# Patient Record
Sex: Male | Born: 1985 | Race: White | Hispanic: Yes | Marital: Married | Smoking: Never smoker
Health system: Southern US, Community
[De-identification: ages and names within clinical notes are randomized; demographics above are authoritative.]

---

## 2014-10-06 ENCOUNTER — Emergency Department (HOSPITAL_COMMUNITY): Payer: No Typology Code available for payment source

## 2014-10-06 ENCOUNTER — Encounter (HOSPITAL_COMMUNITY): Payer: Self-pay | Admitting: Emergency Medicine

## 2014-10-06 ENCOUNTER — Emergency Department (HOSPITAL_COMMUNITY)
Admission: EM | Admit: 2014-10-06 | Discharge: 2014-10-06 | Disposition: A | Discharge status: Home / Self Care | Payer: No Typology Code available for payment source | Attending: Emergency Medicine | Admitting: Emergency Medicine

## 2014-10-06 DIAGNOSIS — Y9389 Activity, other specified: Secondary | ICD-10-CM | POA: Insufficient documentation

## 2014-10-06 DIAGNOSIS — S8992XA Unspecified injury of left lower leg, initial encounter: Secondary | ICD-10-CM | POA: Insufficient documentation

## 2014-10-06 DIAGNOSIS — R200 Anesthesia of skin: Secondary | ICD-10-CM | POA: Diagnosis not present

## 2014-10-06 DIAGNOSIS — S199XXA Unspecified injury of neck, initial encounter: Secondary | ICD-10-CM | POA: Insufficient documentation

## 2014-10-06 DIAGNOSIS — S3992XA Unspecified injury of lower back, initial encounter: Secondary | ICD-10-CM | POA: Insufficient documentation

## 2014-10-06 DIAGNOSIS — Y998 Other external cause status: Secondary | ICD-10-CM | POA: Diagnosis not present

## 2014-10-06 DIAGNOSIS — Y92410 Unspecified street and highway as the place of occurrence of the external cause: Secondary | ICD-10-CM | POA: Insufficient documentation

## 2014-10-06 DIAGNOSIS — M542 Cervicalgia: Secondary | ICD-10-CM

## 2014-10-06 MED ORDER — CYCLOBENZAPRINE HCL 10 MG PO TABS
10.0000 mg | ORAL_TABLET | Freq: Two times a day (BID) | ORAL | Status: AC | PRN
Start: 2014-10-06 — End: ?

## 2014-10-06 MED ORDER — ACETAMINOPHEN 325 MG PO TABS
650.0000 mg | ORAL_TABLET | Freq: Once | ORAL | Status: AC
  Administered 2014-10-06: 650 mg via ORAL
  Filled 2014-10-06: qty 2

## 2014-10-06 MED ORDER — IOHEXOL 350 MG/ML SOLN
50.0000 mL | Freq: Once | INTRAVENOUS | Status: AC | PRN
  Administered 2014-10-06: 50 mL via INTRAVENOUS

## 2014-10-06 NOTE — ED Notes (Signed)
CT notified pt has IV and is ready for transport.
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
## 2014-10-06 NOTE — ED Notes (Signed)
Pt comfortable with discharge and follow up instructions. Pt declines wheelchair, escorted to waiting area by this RN. Prescriptions x1. 
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
## 2022-03-23 ENCOUNTER — Encounter (HOSPITAL_BASED_OUTPATIENT_CLINIC_OR_DEPARTMENT_OTHER): Payer: Self-pay | Admitting: Emergency Medicine

## 2022-03-23 ENCOUNTER — Emergency Department (HOSPITAL_COMMUNITY): Admission: EM | Admit: 2022-03-23 | Discharge: 2022-03-23 | Discharge status: Against Medical Advice | Payer: Self-pay

## 2022-03-23 ENCOUNTER — Emergency Department (HOSPITAL_BASED_OUTPATIENT_CLINIC_OR_DEPARTMENT_OTHER)
Admission: EM | Admit: 2022-03-23 | Discharge: 2022-03-23 | Disposition: A | Discharge status: Home / Self Care | Payer: Self-pay | Attending: Emergency Medicine | Admitting: Emergency Medicine

## 2022-03-23 DIAGNOSIS — L0291 Cutaneous abscess, unspecified: Secondary | ICD-10-CM

## 2022-03-23 DIAGNOSIS — L0231 Cutaneous abscess of buttock: Secondary | ICD-10-CM | POA: Insufficient documentation

## 2022-03-23 MED ORDER — ACETAMINOPHEN 500 MG PO TABS
1000.0000 mg | ORAL_TABLET | Freq: Once | ORAL | Status: AC
  Administered 2022-03-23: 1000 mg via ORAL
  Filled 2022-03-23: qty 2

## 2022-03-23 MED ORDER — LIDOCAINE-EPINEPHRINE (PF) 2 %-1:200000 IJ SOLN
20.0000 mL | Freq: Once | INTRAMUSCULAR | Status: AC
  Administered 2022-03-23: 20 mL
  Filled 2022-03-23: qty 20

## 2022-03-23 MED ORDER — IBUPROFEN 400 MG PO TABS
400.0000 mg | ORAL_TABLET | Freq: Once | ORAL | Status: AC
  Administered 2022-03-23: 400 mg via ORAL
  Filled 2022-03-23: qty 1

## 2022-03-23 NOTE — Discharge Instructions (Addendum)
It was our pleasure to provide your ER care today - we hope that you feel better.  Keep area very clean.  Follow up with your doctor or urgent care in two days time for packing removal/wound check.   Take acetaminophen or ibuprofen as need.   Return to ER if worse, new symptoms, fevers, spreading redness, increased swelling, severe pain, or other concern.

## 2022-03-23 NOTE — ED Notes (Signed)
Discharge instructions reviewed and explained, pt verbalized understanding and had no further questions on departure.

## 2022-03-23 NOTE — ED Provider Notes (Signed)
[facility] EMERGENCY DEPT Provider Note   CSN: [id number] Arrival date & time: 03/23/22  1550     History  No chief complaint on file.   Johnny Lowe is a 36 y.o. male.  Patient c/o left buttock abscess. Symptoms acute onset in the past week, constant, dull, moderate. States had similar abscess several months ago, same area, but popped on own, and resolved then. No hx mrsa. Otherwise does not feel sick or ill. No fever or chills. No n/v. No rash.   The history is provided by the patient and medical records.       Home Medications Prior to Admission medications   Medication Sig Start Date End Date Taking? Authorizing Provider  cyclobenzaprine (FLEXERIL) 10 MG tablet Take 1 tablet (10 mg total) by mouth 2 (two) times daily as needed for muscle spasms. 10/06/14   Gwyneth Sprout, MD      Allergies    Patient has no known allergies.    Review of Systems   Review of Systems  Constitutional:  Negative for chills and fever.  Gastrointestinal:  Negative for nausea and vomiting.  Skin:        abscess    Physical Exam Updated Vital Signs BP (!) 137/94 (BP Location: Right Arm)   Pulse (!) 107   Temp 98 F (36.7 C)   Resp 19   Ht 1.778 m (5\' 10" )   Wt 77.1 kg   SpO2 99%   BMI 24.39 kg/m  Physical Exam Vitals and nursing note reviewed.  Constitutional:      Appearance: Normal appearance. He is well-developed.  HENT:     Head: Atraumatic.     Nose: Nose normal.     Mouth/Throat:     Mouth: Mucous membranes are moist.  Eyes:     General: No scleral icterus. Neck:     Trachea: No tracheal deviation.  Cardiovascular:     Rate and Rhythm: Normal rate.     Pulses: Normal pulses.  Pulmonary:     Effort: Pulmonary effort is normal. No accessory muscle usage or respiratory distress.  Abdominal:     General: There is no distension.     Tenderness: There is no abdominal tenderness.  Genitourinary:    Comments: No cva tenderness. Musculoskeletal:         General: No swelling.     Cervical back: Neck supple.  Skin:    General: Skin is warm and dry.     Findings: No rash.     Comments: Left buttock abscess, ~ 4-5 cm diameter.   Neurological:     Mental Status: He is alert.     Comments: Alert, speech clear.   Psychiatric:        Mood and Affect: Mood normal.     ED Results / Procedures / Treatments   Labs (all labs ordered are listed, but only abnormal results are displayed) Labs Reviewed - No data to display  EKG None  Radiology No results found.  Procedures . Incision and Drainage  Date/Time: 03/23/2022 5:05 PM  Performed by: 03/25/2022, MD Authorized by: Cathren Laine, MD   Consent:    Consent given by:  Patient Location:    Type:  Abscess   Location:  Lower extremity   Lower extremity location:  Buttock   Buttock location:  L buttock Pre-procedure details:    Skin preparation:  Chlorhexidine with alcohol Anesthesia:    Anesthesia method:  Local infiltration   Local anesthetic:  Lidocaine 2% WITH  epi Procedure type:    Complexity:  Complex Procedure details:    Incision types:  Elliptical   Incision depth:  Subcutaneous   Wound management:  Probed and deloculated and irrigated with saline   Drainage:  Purulent   Drainage amount:  Moderate   Packing materials:  1/4 in gauze Post-procedure details:    Procedure completion:  Tolerated well, no immediate complications     Medications Ordered in ED Medications  lidocaine-EPINEPHrine (XYLOCAINE W/EPI) 2 %-1:200000 (PF) injection 20 mL (has no administration in time range)    ED Course/ Medical Decision Making/ A&P                           Medical Decision Making Problems Addressed: Abscess: acute illness or injury with systemic symptoms Left buttock abscess: acute illness or injury with systemic symptoms  Amount and/or Complexity of Data Reviewed External Data Reviewed: notes.  Risk OTC drugs. Prescription drug management.   I and D  abscess.  Acetaminophen po, ibuprofen po.  Reviewed nursing notes and prior charts for additional history.   Sterile dressing applied.           Final Clinical Impression(s) / ED Diagnoses Final diagnoses:  None    Rx / DC Orders ED Discharge Orders     None         Cathren Laine, MD 03/23/22 1747

## 2022-03-23 NOTE — ED Triage Notes (Signed)
Pt endorses spot on left lower buttocks 8 months ago and popped it. Pt noticed a large bump 4 days ago that has gotten bigger on same spot.
